# Patient Record
Sex: Female | Born: 2005 | Race: White | Hispanic: No | Marital: Single | Smoking: Never smoker
Health system: Southern US, Community
[De-identification: ages and names within clinical notes are randomized; demographics above are authoritative.]

---
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
## 2023-01-16 DIAGNOSIS — Z23 Encounter for immunization: Secondary | ICD-10-CM | POA: Diagnosis not present

## 2023-06-19 ENCOUNTER — Ambulatory Visit: Admitting: Family Medicine

## 2023-06-19 ENCOUNTER — Encounter: Payer: Self-pay | Admitting: Family Medicine

## 2023-06-19 VITALS — BP 110/68 | HR 96 | Ht 62.0 in | Wt 157.2 lb

## 2023-06-19 DIAGNOSIS — Z1329 Encounter for screening for other suspected endocrine disorder: Secondary | ICD-10-CM | POA: Diagnosis not present

## 2023-06-19 DIAGNOSIS — Z0000 Encounter for general adult medical examination without abnormal findings: Secondary | ICD-10-CM | POA: Insufficient documentation

## 2023-06-19 DIAGNOSIS — Z1321 Encounter for screening for nutritional disorder: Secondary | ICD-10-CM

## 2023-06-19 DIAGNOSIS — F418 Other specified anxiety disorders: Secondary | ICD-10-CM | POA: Diagnosis not present

## 2023-06-19 DIAGNOSIS — Z0001 Encounter for general adult medical examination with abnormal findings: Secondary | ICD-10-CM | POA: Diagnosis not present

## 2023-06-19 DIAGNOSIS — Z23 Encounter for immunization: Secondary | ICD-10-CM | POA: Diagnosis not present

## 2023-06-19 LAB — CBC WITH DIFFERENTIAL/PLATELET
Absolute Lymphocytes: 1970 {cells}/uL (ref 850–3900)
Absolute Monocytes: 369 {cells}/uL (ref 200–950)
Basophils Absolute: 60 {cells}/uL (ref 0–200)
Basophils Relative: 0.9 %
Eosinophils Absolute: 248 {cells}/uL (ref 15–500)
Eosinophils Relative: 3.7 %
HCT: 39.1 % (ref 35.0–45.0)
Hemoglobin: 13.2 g/dL (ref 11.7–15.5)
MCH: 29.5 pg (ref 27.0–33.0)
MCHC: 33.8 g/dL (ref 32.0–36.0)
MCV: 87.3 fL (ref 80.0–100.0)
MPV: 8.8 fL (ref 7.5–12.5)
Monocytes Relative: 5.5 %
Neutro Abs: 4054 {cells}/uL (ref 1500–7800)
Neutrophils Relative %: 60.5 %
Platelets: 429 10*3/uL — ABNORMAL HIGH (ref 140–400)
RBC: 4.48 10*6/uL (ref 3.80–5.10)
RDW: 11.8 % (ref 11.0–15.0)
Total Lymphocyte: 29.4 %
WBC: 6.7 10*3/uL (ref 3.8–10.8)

## 2023-06-19 LAB — COMPREHENSIVE METABOLIC PANEL WITH GFR
AG Ratio: 1.9 (calc) (ref 1.0–2.5)
ALT: 20 U/L (ref 5–32)
AST: 15 U/L (ref 12–32)
Albumin: 4.6 g/dL (ref 3.6–5.1)
Alkaline phosphatase (APISO): 63 U/L (ref 36–128)
BUN: 13 mg/dL (ref 7–20)
CO2: 27 mmol/L (ref 20–32)
Calcium: 9.7 mg/dL (ref 8.9–10.4)
Chloride: 105 mmol/L (ref 98–110)
Creat: 0.77 mg/dL (ref 0.50–0.96)
Globulin: 2.4 g/dL (ref 2.0–3.8)
Glucose, Bld: 90 mg/dL (ref 65–99)
Potassium: 4.4 mmol/L (ref 3.8–5.1)
Sodium: 140 mmol/L (ref 135–146)
Total Bilirubin: 0.3 mg/dL (ref 0.2–1.1)
Total Protein: 7 g/dL (ref 6.3–8.2)
eGFR: 114 mL/min/{1.73_m2} (ref >=60)

## 2023-06-19 LAB — TSH: TSH: 0.52 m[IU]/L

## 2023-06-19 LAB — LIPID PANEL
Cholesterol: 177 mg/dL — ABNORMAL HIGH (ref <170)
HDL: 53 mg/dL (ref >45)
LDL Cholesterol (Calc): 102 mg/dL (ref <110)
Non-HDL Cholesterol (Calc): 124 mg/dL — ABNORMAL HIGH (ref <120)
Total CHOL/HDL Ratio: 3.3 (calc) (ref <5.0)
Triglycerides: 128 mg/dL — ABNORMAL HIGH (ref <90)

## 2023-06-19 LAB — VITAMIN D 25 HYDROXY (VIT D DEFICIENCY, FRACTURES): Vit D, 25-Hydroxy: 23 ng/mL — ABNORMAL LOW (ref 30–100)

## 2023-06-19 NOTE — Assessment & Plan Note (Signed)

## 2023-06-19 NOTE — Progress Notes (Addendum)
 New Patient Office Visit  Subjective    Patient ID: Amy Parks, female    DOB: 04-11-2005  Age: 18 y.o. MRN: [id number]  CC:  Chief Complaint  Patient presents with   Establish Care    HPI Amy Parks presents to establish care. Oriented to practice routines and expectations. PMH includes none. Concerns today include uncontrolled anxiety and worry with compulsions and obsessing about school work. She is in nursing school. These symptoms were previously medically managed with hormonal contraceptives however caused irregular menstrual cycles so she would not like to resume this option. Is open to seeing a therapist. Menstrual cycles are regular monthly lasting 5-7 days without severe symptoms.   Tobacco: non-smoker STI: declines Vaccines: tdap today     06/19/2023    2:28 PM  GAD 7 : Generalized Anxiety Score  Nervous, Anxious, on Edge 2  Control/stop worrying 2  Worry too much - different things 3  Trouble relaxing 1  Restless 0  Easily annoyed or irritable 2  Afraid - awful might happen 0  Total GAD 7 Score 10       06/19/2023    2:28 PM  Depression screen PHQ 2/9  Decreased Interest 0  Down, Depressed, Hopeless 0  PHQ - 2 Score 0  Altered sleeping 1  Tired, decreased energy 1  Change in appetite 0  Feeling bad or failure about yourself  0  Trouble concentrating 0  Moving slowly or fidgety/restless 0  Suicidal thoughts 0  PHQ-9 Score 2     Outpatient Encounter Medications as of 06/19/2023  Medication Sig   erythromycin  ophthalmic ointment Place a 1/2 inch ribbon of ointment into the lower eyelid. (Patient not taking: Reported on 06/19/2023)   No facility-administered encounter medications on file as of 06/19/2023.    History reviewed. No pertinent past medical history.  History reviewed. No pertinent surgical history.  History reviewed. No pertinent family history.  Social History   Socioeconomic History   Marital status: Single    Spouse  name: Not on file   Number of children: Not on file   Years of education: Not on file   Highest education level: Not on file  Occupational History   Not on file  Tobacco Use   Smoking status: Never   Smokeless tobacco: Never  Substance and Sexual Activity   Alcohol use: Never   Drug use: Never   Sexual activity: Never  Other Topics Concern   Not on file  Social History Narrative   Not on file   Social Drivers of Health   Financial Resource Strain: Not on file  Food Insecurity: No Food Insecurity (11/30/2021)   Received from [facility]   Hunger Vital Sign    Worried About Running Out of Food in the Last Year: Never true    Ran Out of Food in the Last Year: Never true  Transportation Needs: No Transportation Needs (11/30/2021)   Received from [facility]   PRAPARE - Transportation    Lack of Transportation (Medical): No    Lack of Transportation (Non-Medical): No  Physical Activity: Not on file  Stress: Not on file  Social Connections: Not on file  Intimate Partner Violence: Not on file    Review of Systems  Constitutional: Negative.   HENT: Negative.    Eyes: Negative.   Respiratory: Negative.    Cardiovascular: Negative.   Gastrointestinal: Negative.   Genitourinary: Negative.   Musculoskeletal: Negative.   Skin: Negative.   Neurological:  Negative.   Endo/Heme/Allergies: Negative.   Psychiatric/Behavioral:  The patient is nervous/anxious.   All other systems reviewed and are negative.       Objective    BP 110/68   Pulse 96   Ht 5\' 2"  (1.575 m)   Wt 157 lb 4 oz (71.3 kg)   SpO2 98%   BMI 28.76 kg/m   Physical Exam Vitals and nursing note reviewed.  Constitutional:      Appearance: Normal appearance. She is normal weight.  HENT:     Head: Normocephalic and atraumatic.     Right Ear: Tympanic membrane, ear canal and external ear normal.     Left Ear: Tympanic membrane, ear canal and external ear normal.     Nose: Nose normal.      Mouth/Throat:     Mouth: Mucous membranes are moist.     Pharynx: Oropharynx is clear.  Eyes:     Extraocular Movements: Extraocular movements intact.     Conjunctiva/sclera: Conjunctivae normal.     Pupils: Pupils are equal, round, and reactive to light.  Cardiovascular:     Rate and Rhythm: Normal rate and regular rhythm.     Pulses: Normal pulses.     Heart sounds: Normal heart sounds.  Pulmonary:     Effort: Pulmonary effort is normal.     Breath sounds: Normal breath sounds.  Abdominal:     General: Bowel sounds are normal.     Palpations: Abdomen is soft.  Musculoskeletal:        General: Normal range of motion.     Cervical back: Normal range of motion and neck supple.  Skin:    General: Skin is warm and dry.     Capillary Refill: Capillary refill takes less than 2 seconds.  Neurological:     General: No focal deficit present.     Mental Status: She is alert and oriented to person, place, and time. Mental status is at baseline.  Psychiatric:        Mood and Affect: Mood normal.        Behavior: Behavior normal.        Thought Content: Thought content normal.        Judgment: Judgment normal.         Assessment & Plan:   Problem List Items Addressed This Visit     Physical exam, annual - Primary   Today your medical history was reviewed and routine physical exam with labs was performed. Recommend 150 minutes of moderate intensity exercise weekly and consuming a well-balanced diet. Advised to stop smoking if a smoker, avoid smoking if a non-smoker, limit alcohol consumption to 1 drink per day for women and 2 drinks per day for men, and avoid illicit drug use. Counseled on safe sex practices and offered STI testing today. Counseled on the importance of sunscreen use. Counseled in mental health awareness and when to seek medical care. Vaccine maintenance discussed. Appropriate health maintenance items reviewed. Return to office in 1 year for annual physical exam.        Relevant Orders   Ambulatory referral to Psychology   CBC with Differential/Platelet   Comprehensive metabolic panel with GFR   Lipid panel   TSH   VITAMIN D 25 Hydroxy (Vit-D Deficiency, Fractures)   Situational anxiety   Referral to psychology. Encouraged stress mitigation, adequate sleep, proper nutrition and exercise. Will check labs including TSH and vitamin D today      Other Visit Diagnoses  Screening for thyroid disorder       Relevant Orders   TSH     Encounter for vitamin deficiency screening       Relevant Orders   VITAMIN D 25 Hydroxy (Vit-D Deficiency, Fractures)     Immunization due       Relevant Orders   Tdap vaccine greater than or equal to 7yo IM (Completed)       Return in about 1 year (around 06/18/2024) for annual physical with labs 1 week prior.   Jenelle Mis, FNP

## 2023-06-19 NOTE — Assessment & Plan Note (Signed)
 Referral to psychology. Encouraged stress mitigation, adequate sleep, proper nutrition and exercise. Will check labs including TSH and vitamin D today

## 2023-06-19 NOTE — Patient Instructions (Signed)
 It was great to meet you today and I'm excited to have you join the [facility] practice. I hope you had a positive experience today! If you feel so inclined, please feel free to recommend our practice to friends and family. Kurtis Bushman, FNP-C

## 2023-06-20 ENCOUNTER — Ambulatory Visit: Payer: Self-pay | Admitting: Family Medicine

## 2023-07-18 ENCOUNTER — Ambulatory Visit: Admitting: Licensed Clinical Social Worker

## 2023-07-18 DIAGNOSIS — F411 Generalized anxiety disorder: Secondary | ICD-10-CM

## 2023-07-18 NOTE — Progress Notes (Signed)
  Behavioral Health Counselor/Therapist Progress Note  Patient ID: Amy Parks, MRN: [id number]    Date: 07/18/23  Time Spent: 1003  am - 1100 am : 57 Minutes  Treatment Type: Initial Assessment /Treatment plan  Presenting Problem Chief Complaint: Patient reports taking hormonal birth control for 2 years and came off of it last summer. Patient reports recently she has struggled with anxiety and depression. She states that she has obsessive thoughts such as related to OCD behaviors. Patient reports that school is a source of anxiety.   What are the main stressors in your life right now, how long? Anxiety   3, Mood Swings  3, Sleep Changes   3, Racing Thoughts   3, Irritability   3, Excessive Worrying   3, Obsessive Thoughts   3, Ritualistic Behaviors   3, Checking   3, and Counting   3   Previous mental health services Have you ever been treated for a mental health problem, when, where, by whom? No     Are you currently seeing a therapist or counselor, counselor's name? No   Have you ever had a mental health hospitalization, how many times, length of stay? No   Have you ever been treated with medication, name, reason, response? No   Have you ever had suicidal thoughts or attempted suicide, when, how? No   Risk factors for Suicide Demographic factors:  Adolescent or young adult, Caucasian, and Unemployed Current mental status: No plan to harm self or others Loss factors: NA Historical factors: NA Risk Reduction factors: Sense of responsibility to family, Religious beliefs about death, Living with another person, especially a relative, and Positive social support Clinical factors:  Severe Anxiety and/or Agitation Cognitive features that contribute to risk: NA    SUICIDE RISK:  Minimal: No identifiable suicidal ideation.  Patients presenting with no risk factors but with morbid ruminations; may be classified as minimal risk based on the severity of the depressive  symptoms  Medical history Medical treatment and/or problems, explain: No  Do you have any issues with chronic pain?  No  Name of primary care physician/last physical exam: Amber Howard-Jun 19, 2023  Allergies: No Medication, reactions? NA   Current medications: NA Prescribed by: NA Is there any history of mental health problems or substance abuse in your family, whom? No NA Has anyone in your family been hospitalized, who, where, length of stay? No NA  Social/family history Have you been married, how many times?  0  Do you have children?  0  How many pregnancies have you had?  0  Who lives in your current household? Patient and her mother.  Military history: No   Religious/spiritual involvement: Attends [employer] religion/faith base are you? Christian/Baptist  Family of origin (childhood history) Mom and Dad and patient, parents divorced when patient was in 8th grade Where were you born? [place] Where did you grow up? [place] How many different homes have you lived? 3 Describe the atmosphere of the household where you grew up: Parents were loving and stable until it wasn't. Do you have siblings, step/half siblings, list names, relation, sex, age? No NA  Are your parents separated/divorced, when and why? Yes Parents divorced when patient was in 8 th grade.  Are your parents alive? Yes Both parents sill alive.  Social supports (personal and professional): MOM  Education How many grades have you completed? Junior in college. Did you have any problems in school, what type? No  Medications prescribed for  these problems? 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 Patient denied financial issues.   Legal history: Patient denied legal issues   Trauma/Abuse history: Have you ever been exposed to any form of abuse, what type? No   Have you ever been exposed to something traumatic,  describe? No   Substance use Do you use Caffeine? Yes Type, frequency? 1 soda every 2 days.  Do you use Nicotine? No Type, frequency, ppd? NA   Do you use Alcohol? No Type, frequency? NA  How old were you went you first tasted alcohol? NA Was this accepted by your family? NA  When was your last drink, type, how much? NA  Have you ever used illicit drugs or taken more than prescribed, type, frequency, date of last usage? No NA  Mental Status: General Appearance /Behavior:  Casual Eye Contact:  Good Motor Behavior:  Normal Speech:  Normal Level of Consciousness:  Alert Mood:  Appropriate Affect:  Appropriate Anxiety Level:  Minimal Thought Process:  Coherent Thought Content:  WNL Perception:  Normal Judgment:  Good Insight:  Present Cognition:  Orientation time, place, and person  Diagnosis AXIS I Generalized Anxiety Disorder  AXIS II No diagnosis  AXIS III No health conditions  AXIS IV occupational problems  AXIS V 61-70 mild symptoms    Mental Status Exam: Appearance:  Casual     Behavior: Appropriate  Motor: Normal  Speech/Language:  Clear and Coherent  Affect: Appropriate  Mood: normal  Thought process: normal  Thought content:   WNL  Sensory/Perceptual disturbances:   WNL  Orientation: oriented to person, place, time/date, situation, day of week, month of year, and year  Attention: Good  Concentration: Good  Memory: WNL  Fund of knowledge:  Good  Insight:   Good  Judgment:  Good  Impulse Control: Good   Risk Assessment: Danger to Self:  No Self-injurious Behavior: No Danger to Others: No Duty to Warn:no Physical Aggression / Violence:No  Access to Firearms a concern: No  Gang Involvement:No   Subjective:   Amy Parks participated from office, located at [employer] in person. Amy Parks consented to treatment. Therapist participated from office located at [facility].    Interventions: Cognitive Behavioral Therapy,  Dialectical Behavioral Therapy, Assertiveness/Communication, Motivational Interviewing, and Solution-Oriented/Positive Psychology  Diagnosis: Generalized Anxiety Disorder  Individualized Treatment Plan Strengths: I am very disciplined, and I am good at school.  Supports: Patient reports that her Mother is her primary support.   Goal/Needs for Treatment:  In order of importance to patient 1) I want to learn to cope with my anxiety and not let it control my life. 2) I don't want to over think situations. 3) I don't want repetitive lists to control my daily life.   Client Statement of Needs: Patient desires to gain coping skills for her anxiety symptoms.   Treatment Level:Moderate/Bi weekly  Symptoms: Anxiety, irritability, mood swings, poor sleep.  Client Treatment Preferences: FACE TO FACE   Healthcare consumer's goal for treatment:  Therapist Keenan Pastor MSW, LCSW will support the patient's ability to achieve the goals identified. Cognitive Behavioral Therapy, Assertive Communication/Conflict Resolution Training, Relaxation Training, ACT, Humanistic and other evidenced-based practices will be used to promote progress towards healthy functioning.   Healthcare consumer will: Actively participate in therapy, working towards healthy functioning.    *Justification for Continuation/Discontinuation of Goal: R=Revised, O=Ongoing, A=Achieved, D=Discontinued  Goal 1) I want to learn to cope with my  anxiety and not let it control my life. Baseline date 07/18/2023: Progress towards goal Ongoing; How Often - Daily Target Date Goal Was reviewed Status Code Progress towards goal/Likert rating  07/17/2024  O Ongoing            Deep Breathing: Slow, deliberate breaths can activate the parasympathetic nervous system, which counteracts the fight-or-flight response. Diaphragmatic breathing, where you breathe deeply into your abdomen, is particularly effective.  Grounding Exercises: These  techniques help you focus on the present moment and can reduce the feeling of being overwhelmed by anxiety. Examples include the 5-4-3-2-1 method, where you identify five things you can see, four you can touch, three you can hear, two you can smell, and one you can taste, or the 3-3-3 rule, where you identify three things you see, three you hear, and three body movements.  Mindful Awareness: Paying attention to your senses and the present moment can help shift your focus away from anxious thoughts. This can be achieved through activities like walking in nature, listening to music, or enjoying a sensory experience like a massage.  Progressive Muscle Relaxation: This technique involves systematically tensing and relaxing different muscle groups in your body, helping to release physical tension associated with anxiety.  Cognitive Reframing: Challenging negative or irrational thoughts and replacing them with more balanced and realistic perspectives can help manage anxiety.  Long-Term Coping Strategies: Regular Exercise: Physical activity releases endorphins, which have mood-boosting effects, and can help reduce stress hormones.  Adequate Sleep: Prioritizing sleep is crucial for mental and physical health, as sleep deprivation can exacerbate anxiety.  Mindful Eating: Paying attention to your food, savoring each bite, and avoiding excessive caffeine and alcohol can positively impact your mood and reduce anxiety.  Stress Management Techniques: Incorporating relaxation techniques like meditation, yoga, or breathing exercises into your daily routine can help manage stress and reduce anxiety.  Social Support: Connecting with friends, family, or support groups can provide emotional support and reduce feelings of isolation.   Goal 2) I don't want to over think situations. Baseline date 07/18/2023: Progress towards goal Ongoing; How Often - Daily Target Date Goal Was reviewed Status Code Progress towards  goal  07/17/2024  O Ongoing            1. Practice Mindfulness and Present Moment Awareness:  Mindfulness helps you focus on the present moment, reducing the tendency to dwell on the past or future.  Techniques like deep breathing, meditation, and grounding exercises can enhance mindfulness.  Try to notice your thoughts and feelings without judgment, simply acknowledging them as they arise and letting them pass.  2. Challenge Negative Thoughts and Reframing: Identify negative or anxious thoughts and question their validity.  Ask yourself if there is evidence to support those thoughts, or if they are based on assumptions.  Reframe negative thoughts into more positive or realistic ones.  3. Focus on Actionable Steps and Problem-Solving: Instead of ruminating on problems, break them down into smaller, manageable steps.  Focus on what you can control and ignore what you can't.  Develop a solution-oriented mindset by brainstorming potential solutions and taking action.  4. Utilize Distraction Techniques:  Engage in activities that take your mind off of overthinking, such as reading, listening to music, or spending time in nature.  Social interaction and physical activity can also be effective distractions.  Create a routine that incorporates these healthy distractions into your daily life.  5. Seek Professional Help: If overthinking is significantly impacting your daily life, consider seeking  professional help from a therapist or counselor.  Cognitive Behavioral Therapy (CBT) is a proven treatment for overthinking and anxiety.  A therapist can help you develop coping strategies and address any underlying issues that may be contributing to your overthinking.  6. Self-Compassion:  Practice self-compassion by treating yourself with kindness and understanding, especially when you're struggling with overthinking. Recognize that everyone makes mistakes and that it's okay to not be perfect. Focus on  your strengths and accomplishments, and acknowledge your progress. 7. Time Management and Time Limits:  Set specific times to worry or reflect on a problem, and then commit to moving on after that time.  This can help you avoid getting stuck in a cycle of overthinking and can help you to break the habit.  8. Journaling and Writing: Writing down your thoughts and feelings can help you process them and gain perspective.  It can also help you identify patterns of overthinking and develop coping strategies.   Goal 3) I don't want repetitive lists to control my daily life. Baseline date 07/18/2023: Progress towards goal Ongoing; How Often - Daily Target Date Goal Was reviewed Status Code Progress towards goal  07/17/2024  O Ongoing            Reframe your perspective on to-do lists: See lists as a tool, not a measure of your worth. Your value is not determined by how much you check off, says a YouTube video. Focus on what matters most. Prioritize tasks that are truly important and impactful. Allow yourself to rest and slow down. It's ok to not get everything done.  2. Optimize your list-making process: Brain dump everything. Write down all your tasks to get them out of your head and onto paper or a digital tool. Categorize and prioritize tasks. Consider using a tool like the [employer] to prioritize tasks by urgency and importance. Set realistic deadlines. Avoid overcommitting and allow for flexibility. Break down large tasks into smaller steps. This makes them less overwhelming and easier to complete. Use a digital tool or app. This can help you organize, categorize, and schedule tasks. Try a weekly instead of a daily list. This can help you pace yourself better. Review and update lists regularly. This helps you stay focused and make adjustments as needed.  3. Shift your mindset and habits: Embrace acceptance and let go of control. Recognize that some things are beyond your  control. Practice mindfulness. Focus on the present moment and engage fully in what you're doing. Limit distractions and single-task. Minimize multitasking to improve focus and allow for spontaneity. Create boundaries. Learn to say no to protect your time and energy. Practice self-compassion. Don't beat yourself up for not completing everything on your list.  4. Introduce flexibility and spontaneity: Leave room for unplanned activities. Don't overschedule yourself, leaving space for spontaneity. Embrace new experiences. Challenge yourself to step out of your comfort zone and try new things. Engage in creative pursuits and hobbies. These activities can help you tap into a spontaneous flow of inspiration.   This plan has been reviewed and created by the following participants:  This plan will be reviewed at least every 12 months. Date Behavioral Health Clinician Date Guardian/Patient   07/18/2023 Keenan Pastor MSW, LCSW  07/18/2023 Verbal Consent Provided                  Keenan Pastor MSW, LCSW/DATE 07/18/2023

## 2023-08-01 ENCOUNTER — Ambulatory Visit: Admitting: Licensed Clinical Social Worker

## 2023-08-22 ENCOUNTER — Ambulatory Visit: Admitting: Licensed Clinical Social Worker

## 2023-08-22 DIAGNOSIS — F411 Generalized anxiety disorder: Secondary | ICD-10-CM | POA: Diagnosis not present

## 2023-08-22 NOTE — Progress Notes (Unsigned)
 [facility] Counselor/Therapist Progress Note  Patient ID: Amy Parks, MRN: [id number]    Date: 08/22/23  Time Spent: 1102  am - 1158 am : 56 Minutes  Treatment Type: Individual Therapy.  Reported Symptoms: Patient reports taking hormonal birth control for 2 years and came off of it last summer. Patient reports recently she has struggled with anxiety and depression. She states that she has obsessive thoughts such as related to OCD behaviors. Patient reports that school is a source of anxiety.   Mental Status Exam: Appearance:  Casual     Behavior: Appropriate  Motor: Normal  Speech/Language:  {PSY:22685}  Affect: {PSY:22687}  Mood: {PSY:31886}  Thought process: {PSY:31888}  Thought content:   {PSY:[id number]}  Sensory/Perceptual disturbances:   {PSY:463 219 0923}  Orientation: {PSY:30297}  Attention: {PSY:22877}  Concentration: {[phone]}  Memory: {[phone]}  Fund of knowledge:  {[phone]}  Insight:   {[phone]}  Judgment:  {[phone]}  Impulse Control: {[phone]}   Risk Assessment: Danger to Self:  {PSY:22692} Self-injurious Behavior: {PSY:22692} Danger to Others: {PSY:22692} Duty to Warn:{PSY:311194} Physical Aggression / Violence:{PSY:21197} Access to Firearms a concern: {PSY:21197} Gang Involvement:{PSY:21197}  Subjective:   Amy Parks participated from {Patient Location:[address]::home}, via {LBBHVIDEOORPHONE:26720}, and consented to treatment. Therapist participated from {LBBHPROVIDERLOCATION:26721}. We met online due to COVID pandemic.   ***   Interventions: {PSY:564 577 3323}  Diagnosis: No diagnosis found.   Plan: ***Patient is to use CBT, mindfulness and coping skills to help manage decrease symptoms associated with their diagnosis.   Long-term goal:   ***Reduce overall level, frequency, and intensity of the feelings of depression, anxiety and panic evidenced by       decreased irritability,  negative self talk, and helpless feelings from 6 to 7 days/week to 0 to 1 days/week per client report for at least 3 consecutive months.  Short-term goal:  ***Verbally express understanding of the relationship between feelings of depression, anxiety and their impact on thinking patterns and behaviors. Verbalize an understanding of the role that distorted thinking plays in creating fears, excessive worry, and ruminations.  Damien Junk MSW, LCSW/DATE

## 2023-08-23 NOTE — Progress Notes (Signed)
 [facility] Counselor/Therapist Progress Note  Patient ID: Amy Parks, MRN: [id number]    Date: 08/22/2023  Time Spent: 1103  am - 1200 pm : 57 Minutes  Treatment Type: Individual Therapy.  Reported Symptoms: Patient reports taking hormonal birth control for 2 years and came off of it last summer. Patient reports recently she has struggled with anxiety and depression. She states that she has obsessive thoughts such as related to OCD behaviors. Patient reports that school is a source of anxiety.   Mental Status Exam: Appearance:  Casual     Behavior: Appropriate  Motor: Normal  Speech/Language:  Clear and Coherent  Affect: Appropriate  Mood: normal  Thought process: normal  Thought content:   WNL  Sensory/Perceptual disturbances:   WNL  Orientation: oriented to person, place, time/date, situation, day of week, month of year, and year  Attention: Good  Concentration: Good  Memory: WNL  Fund of knowledge:  Good  Insight:   Good  Judgment:  Good  Impulse Control: Good   Risk Assessment: Danger to Self:  No Self-injurious Behavior: No Danger to Others: No Duty to Warn:no Physical Aggression / Violence:No  Access to Firearms a concern: No  Gang Involvement:No   Subjective:   Amy Parks participated in person from office located at [employer]. Amy Parks consented to treatment. Therapist participated from office located at [facility].   Amy Parks presented for her session eager to share that she has been doing well. She reports that it's summer so she is out of school and doesn't feel the amount of stress she typically does. Patient was able to identify a time in her life that she felt she had to prove herself and was able to connect that she makes high expectations for herself and feels she must always perform to that level.   Clinician actively listened and engaged with patient providing support and encouragement. Clinician processed  patients feeling with patient and discussed the advantages and disadvantages and the difficulty to sometimes to find balance. Clinician and patient discussed making list and including leisure time in the agenda as well as important task. We processed self care and the role it plays in overall wellness.   Amy Parks will continue to utilize coping skills to decrease her symptoms of anxiety. Patient will continue to engage in bi weekly therapy sessions. Patient is treatment motivated and is fully engaged in session.Treatment planning to be reviewed by 07/17/2024.  Interventions: Cognitive Behavioral Therapy, Dialectical Behavioral Therapy, Assertiveness/Communication, Motivational Interviewing, and Solution-Oriented/Positive Psychology  Diagnosis: Generalized Anxiety Disorder    Damien Junk MSW, LCSW/DATE 08/22/2023

## 2023-09-05 ENCOUNTER — Ambulatory Visit: Admitting: Licensed Clinical Social Worker

## 2023-09-05 DIAGNOSIS — F411 Generalized anxiety disorder: Secondary | ICD-10-CM | POA: Diagnosis not present

## 2023-09-05 NOTE — Progress Notes (Unsigned)
 [facility] Counselor/Therapist Progress Note  Patient ID: Amy Parks, MRN: [id number]    Date: 09/05/23  Time Spent: 106  pm - 0204 pm : 58 Minutes  Treatment Type: Individual Therapy.  Reported Symptoms: Patient reports taking hormonal birth control for 2 years and came off of it last summer. Patient reports recently she has struggled with anxiety and depression. She states that she has obsessive thoughts such as related to OCD behaviors. Patient reports that school is a source of anxiety.    Mental Status Exam: Appearance:  Casual     Behavior: Appropriate  Motor: Normal  Speech/Language:  Clear and Coherent  Affect: Appropriate  Mood: normal  Thought process: normal  Thought content:   WNL  Sensory/Perceptual disturbances:   WNL  Orientation: oriented to person, place, time/date, situation, day of week, month of year, and year  Attention: Good  Concentration: Good  Memory: WNL  Fund of knowledge:  Good  Insight:   Good  Judgment:  Good  Impulse Control: Good    Risk Assessment: Danger to Self:  No Self-injurious Behavior: No Danger to Others: No Duty to Warn:no Physical Aggression / Violence:No  Access to Firearms a concern: No  Gang Involvement:No    Subjective:    Amy Parks Amy Parks participated in person from office located at [employer]. Wilbert consented to treatment. Therapist participated from office located at [facility].   Stephen presented for her session clearly upset about her schedule at school for the upcoming semester. 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 She reports that she read from slides and didn't really teach the students. Patient states she had her for 3 classes her last semester and requested a different professor this time for additional nursing classes also being taught by this professor. Patient reports that a friend contacted her letting her know  that they both had yet again been placed with this professor. Patient reports that her anxiety and OCD have been out of control since she found this out. She reports that she was very calm about going back to school and now she feels very stressed. Patient reports that she passed all the classes with A's but feels she had to work harder because she was teaching herself the material. She reports a high level of anxiety for the upcoming year.  Clinician actively listened and provided patient with positive support and encouragement. Clinician processed with patient her concerns and discussed options for getting what she needs from the class, including calling a meeting with the professor. Clinician and patient processed ideas for study groups and  reading and working ahead to remain on top of the assignments.  Amy Parks is always pleasant and cooperative and is motivated for treatment. 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 Patient worked on Nurse, children's and processed her feelings about failure. Patient will continue to utilize coping skills to decrease symptoms of anxiety and improve overall wellness. Patient will continue to engage in bi weekly therapy sessions. Treatment planning is to be reviewed by 07/17/2024.  Interventions: Cognitive Behavioral Therapy, Assertiveness/Communication, Motivational Interviewing, and Solution-Oriented/Positive Psychology  Diagnosis: Generalized Anxiety Disorder   Damien Junk MSW, LCSW/DATE 09/05/2023

## 2023-09-11 ENCOUNTER — Telehealth: Payer: Self-pay

## 2023-09-11 NOTE — Telephone Encounter (Signed)
 Copied from CRM #[id number]. Topic: Medical Record Request - Records Request >> Sep 11, 2023  9:52 AM Tiffany S wrote: Reason for CRM: Patient needs copy of vaccine for TDAP needs it for school please follow up with patient

## 2023-09-26 ENCOUNTER — Ambulatory Visit (INDEPENDENT_AMBULATORY_CARE_PROVIDER_SITE_OTHER): Admitting: Licensed Clinical Social Worker

## 2023-09-26 DIAGNOSIS — F411 Generalized anxiety disorder: Secondary | ICD-10-CM

## 2023-09-26 NOTE — Progress Notes (Unsigned)
 [facility] Counselor/Therapist Progress Note  Patient ID: MEKA LEWAN, MRN: [id number]    Date: 09/26/23  Time Spent: 0200  pm - 0253 pm : 53 Minutes  Treatment Type: Individual Therapy.  Reported Symptoms: Patient reports taking hormonal birth control for 2 years and came off of it last summer. Patient reports recently she has struggled with anxiety and depression. She states that she has obsessive thoughts such as related to OCD behaviors. Patient reports that school is a source of anxiety.    Mental Status Exam: Appearance:  Casual     Behavior: Appropriate  Motor: Normal  Speech/Language:  Clear and Coherent  Affect: Appropriate  Mood: normal  Thought process: normal  Thought content:   WNL  Sensory/Perceptual disturbances:   WNL  Orientation: oriented to person, place, time/date, situation, day of week, month of year, and year  Attention: Good  Concentration: Good  Memory: WNL  Fund of knowledge:  Good  Insight:   Good  Judgment:  Good  Impulse Control: Good    Risk Assessment: Danger to Self:  No Self-injurious Behavior: No Danger to Others: No Duty to Warn:no Physical Aggression / Violence:No  Access to Firearms a concern: No  Gang Involvement:No    Subjective:    Nidia LITTIE Georgi participated via video from her dorm room. Lyne is aware of risk and limitations and consented to treatment. Therapist participated from office located at [facility].   Brianne presented for her session from her dorm room. 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 She reports that she didn't get to choose. 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 Madalynn reports that she wants some ideas on how to not focus on lists and  stress herself out with dwelling on how much she has to do.  Clinician actively listened and provided support via verbal feedback. Clinician processed with patient the advantages of doing her rotation at the Hospital in [place], as its affiliated with a hospitals local to her home. 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 We processed ideas for decreasing stress and focusing on lists. Clinician processed with patient the importance of looking at task one at a time. While having lists are very helpful, its important not to look at it as a whole. One day at a time and one task at a time. We also processed balance. It's important to make time for rest, socializing and self-care. (write down all your tasks to clear your mind, then prioritize them by importance and break large tasks into smaller, manageable steps. Also, incorporate self-care like taking breaks and getting enough sleep, and focus only on what you can control to reduce stress and regain a sense of calm.)  Florene was fully engaged in session and was motivated for treatment. She presents with a positive attitude and is open to suggestions and feedback. Rashida will continue to use CBT, mindfulness and coping skills to help manage decrease symptoms associated with their diagnosis. She will continue to engage in bi weekly therapy sessions. Treatment planning to be reviewed by 07/17/2024.   Interventions: Cognitive Behavioral Therapy, Dialectical Behavioral Therapy, Motivational Interviewing, and Solution-Oriented/Positive Psychology   Diagnosis: Generalized Anxiety Disorder  Damien Junk MSW, LCSW/DATE 09/26/2023

## 2023-10-12 ENCOUNTER — Ambulatory Visit: Admitting: Licensed Clinical Social Worker

## 2024-02-13 ENCOUNTER — Other Ambulatory Visit

## 2024-02-13 DIAGNOSIS — Z111 Encounter for screening for respiratory tuberculosis: Secondary | ICD-10-CM

## 2024-02-16 LAB — QUANTIFERON-TB GOLD PLUS
Mitogen-NIL: >10 [IU]/mL
NIL: 0.02 [IU]/mL
QuantiFERON-TB Gold Plus: NEGATIVE
TB1-NIL: 0 [IU]/mL
TB2-NIL: 0 [IU]/mL
